# Patient Record
Sex: Male | Born: 1965 | Race: Black or African American | Hispanic: No | Marital: Single | Smoking: Current every day smoker
Health system: Southern US, Community
[De-identification: ages and names within clinical notes are randomized; demographics above are authoritative.]

## PROBLEM LIST (undated history)

## (undated) DIAGNOSIS — N289 Disorder of kidney and ureter, unspecified: Secondary | ICD-10-CM

## (undated) DIAGNOSIS — Z951 Presence of aortocoronary bypass graft: Secondary | ICD-10-CM

## (undated) HISTORY — PX: AV FISTULA PLACEMENT: SHX1204

---

## 2018-12-27 ENCOUNTER — Other Ambulatory Visit: Payer: Self-pay

## 2018-12-27 ENCOUNTER — Emergency Department (HOSPITAL_COMMUNITY)
Admission: EM | Admit: 2018-12-27 | Discharge: 2018-12-27 | Disposition: A | Discharge status: Home / Self Care | Payer: Medicare Other | Attending: Emergency Medicine | Admitting: Emergency Medicine

## 2018-12-27 ENCOUNTER — Emergency Department (HOSPITAL_COMMUNITY): Payer: Medicare Other

## 2018-12-27 ENCOUNTER — Encounter (HOSPITAL_COMMUNITY): Payer: Self-pay | Admitting: Emergency Medicine

## 2018-12-27 DIAGNOSIS — F17200 Nicotine dependence, unspecified, uncomplicated: Secondary | ICD-10-CM | POA: Diagnosis not present

## 2018-12-27 DIAGNOSIS — Z992 Dependence on renal dialysis: Secondary | ICD-10-CM | POA: Insufficient documentation

## 2018-12-27 DIAGNOSIS — N186 End stage renal disease: Secondary | ICD-10-CM | POA: Insufficient documentation

## 2018-12-27 DIAGNOSIS — R0602 Shortness of breath: Secondary | ICD-10-CM | POA: Insufficient documentation

## 2018-12-27 DIAGNOSIS — R079 Chest pain, unspecified: Secondary | ICD-10-CM | POA: Diagnosis present

## 2018-12-27 DIAGNOSIS — R0600 Dyspnea, unspecified: Secondary | ICD-10-CM | POA: Insufficient documentation

## 2018-12-27 HISTORY — DX: Presence of aortocoronary bypass graft: Z95.1

## 2018-12-27 HISTORY — DX: Disorder of kidney and ureter, unspecified: N28.9

## 2018-12-27 LAB — CBC
HCT: 46.2 % (ref 39.0–52.0)
Hemoglobin: 14.4 g/dL (ref 13.0–17.0)
MCH: 31.3 pg (ref 26.0–34.0)
MCHC: 31.2 g/dL (ref 30.0–36.0)
MCV: 100.4 fL — ABNORMAL HIGH (ref 80.0–100.0)
Platelets: 155 10*3/uL (ref 150–400)
RBC: 4.6 MIL/uL (ref 4.22–5.81)
RDW: 13.4 % (ref 11.5–15.5)
WBC: 5.3 10*3/uL (ref 4.0–10.5)
nRBC: 0 % (ref 0.0–0.2)

## 2018-12-27 LAB — TROPONIN I (HIGH SENSITIVITY)
Troponin I (High Sensitivity): UNDETERMINED ng/L (ref <18)
Troponin I (High Sensitivity): 25 ng/L — ABNORMAL HIGH (ref <18)

## 2018-12-27 LAB — BASIC METABOLIC PANEL
Anion gap: 18 — ABNORMAL HIGH (ref 5–15)
BUN: 42 mg/dL — ABNORMAL HIGH (ref 6–20)
CO2: 21 mmol/L — ABNORMAL LOW (ref 22–32)
Calcium: 9.8 mg/dL (ref 8.9–10.3)
Chloride: 95 mmol/L — ABNORMAL LOW (ref 98–111)
Creatinine, Ser: 12.43 mg/dL — ABNORMAL HIGH (ref 0.61–1.24)
GFR calc Af Amer: 5 mL/min — ABNORMAL LOW (ref >60)
GFR calc non Af Amer: 4 mL/min — ABNORMAL LOW (ref >60)
Glucose, Bld: 101 mg/dL — ABNORMAL HIGH (ref 70–99)
Potassium: 5.4 mmol/L — ABNORMAL HIGH (ref 3.5–5.1)
Sodium: 134 mmol/L — ABNORMAL LOW (ref 135–145)

## 2018-12-27 MED ORDER — NITROGLYCERIN 0.4 MG SL SUBL
0.4000 mg | SUBLINGUAL_TABLET | Freq: Once | SUBLINGUAL | Status: AC
  Administered 2018-12-27: 0.4 mg via SUBLINGUAL
  Filled 2018-12-27: qty 1

## 2018-12-27 MED ORDER — NITROGLYCERIN 0.4 MG SL SUBL: 0.4000 mg | SUBLINGUAL_TABLET | Freq: Once | SUBLINGUAL | Status: DC

## 2018-12-27 MED ORDER — NITROGLYCERIN 0.4 MG SL SUBL
0.4000 mg | SUBLINGUAL_TABLET | Freq: Once | SUBLINGUAL | Status: AC | PRN
  Administered 2018-12-27: 0.4 mg via SUBLINGUAL

## 2018-12-27 MED ORDER — SODIUM CHLORIDE 0.9% FLUSH: 3.0000 mL | Freq: Once | INTRAVENOUS | Status: DC

## 2018-12-27 NOTE — ED Notes (Signed)
Nurse first spoke with pt.  He is here for chest pain.  States someone attempted to get labs without success.  Pt back to triage for labwork.

## 2018-12-27 NOTE — ED Notes (Signed)
Multiple attempts to redraw trop with EMT's. Phlebotomy is at bedside to attempt.

## 2018-12-27 NOTE — ED Triage Notes (Signed)
Pt presents today with midsternal chest pain that started 2 days ago. Pt reports the pain is the same thing he had when he had his triple by-pass.

## 2018-12-27 NOTE — Discharge Instructions (Addendum)
Appears to be due to some inflammation of the sac around her heart.  Given your history and your chest pain I do recommend you to see a cardiologist sometime in the very near future.

## 2018-12-27 NOTE — ED Notes (Signed)
Patient verbalizes understanding of discharge instructions. Opportunity for questioning and answers were provided. Armband removed by staff, pt discharged from ED.  

## 2018-12-27 NOTE — ED Provider Notes (Addendum)
[place] EMERGENCY DEPARTMENT Provider Note   CSN: [id number] Arrival date & time: 12/27/18  1240     History   Chief Complaint Chief Complaint  Patient presents with  . Chest Pain    HPI John Haley is a 53 y.o. male who presents with roughly 10-14 day history of substernal chest pain.  He states that the pain feels like there is a heaviness in his chest and he has having some subjective trouble breathing.  Patient says he is not taking thing for the pain.  He says that "kind of feels like when I had to have my bypass done".  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  Patient has ESRD and dialyzes through a right EC fistula.  He dialyzes on Monday Wednesday Friday.  He takes Imdur 30 mg daily on days he does not get dialysis.  He states he did not take his Imdur today.  He has been having exertional chest pain since January 2020 per care everywhere notes.  Past Medical History:  Diagnosis Date  . Renal disorder    Stage IV  . S/P triple vessel bypass     There are no active problems to display for this patient.   Past Surgical History:  Procedure Laterality Date  . AV FISTULA PLACEMENT      Home Medications    Prior to Admission medications   Not on File    Family History No family history on file.  Social History Social History   Tobacco Use  . Smoking status: Current Every Day Smoker  . Smokeless tobacco: Never Used  Substance Use Topics  . Alcohol use: Not Currently  . Drug use: Never     Allergies   Patient has no known allergies.   Review of Systems Review of Systems  Constitutional: Negative for activity change, appetite change, chills, fatigue and fever.  Respiratory: Positive for chest tightness and shortness of breath.   Cardiovascular: Positive for chest pain. Negative for palpitations and leg swelling.  Gastrointestinal: Negative for constipation, diarrhea, nausea and vomiting.  Musculoskeletal: Negative for  arthralgias and joint swelling.  Skin: Negative for color change.  Neurological: Negative for dizziness, weakness and light-headedness.     Physical Exam Updated Vital Signs BP (!) 159/93   Pulse 64   Temp 97.6 F (36.4 C) (Oral)   Resp 19   Ht 6' (1.829 m)   Wt 88.5 kg   SpO2 100%   BMI 26.45 kg/m   Physical Exam Constitutional:      General: He is not in acute distress.    Appearance: He is well-developed and normal weight. He is not ill-appearing.  Cardiovascular:     Rate and Rhythm: Normal rate and regular rhythm.     Heart sounds: Normal heart sounds. No murmur. No friction rub.  Pulmonary:     Breath sounds: Normal breath sounds.  Chest:     Chest wall: No mass or deformity.  Abdominal:     General: Bowel sounds are normal. There is no abdominal bruit.     Palpations: Abdomen is soft. There is no fluid wave.  Musculoskeletal: Normal range of motion.     Right lower leg: No edema.     Left lower leg: No edema.     Comments: Right brachiocephalic AV fistula in place.  Good thrill.  Neurological:     General: No focal deficit present.     Mental Status: He is alert.  Cranial Nerves: No cranial nerve deficit.      ED Treatments / Results  Labs (all labs ordered are listed, but only abnormal results are displayed) Labs Reviewed  BASIC METABOLIC PANEL - Abnormal; Notable for the following components:      Result Value   Sodium 134 (*)    Potassium 5.4 (*)    Chloride 95 (*)    CO2 21 (*)    Glucose, Bld 101 (*)    BUN 42 (*)    Creatinine, Ser 12.43 (*)    GFR calc non Af Amer 4 (*)    GFR calc Af Amer 5 (*)    Anion gap 18 (*)    All other components within normal limits  CBC - Abnormal; Notable for the following components:   MCV 100.4 (*)    All other components within normal limits  TROPONIN I (HIGH SENSITIVITY)  TROPONIN I (HIGH SENSITIVITY)    EKG EKG Interpretation  Date/Time:  Sunday December 27 2018 12:55:13 EDT Ventricular Rate:  82  PR Interval:  170 QRS Duration: 110 QT Interval:  382 QTC Calculation: 446 R Axis:   -74 Text Interpretation:  Normal sinus rhythm Incomplete right bundle branch block Left anterior fascicular block Confirmed by Zavitz, Joshua ([id number]) on 12/27/2018 5:48:19 PM   Radiology Dg Chest 2 View  Result Date: 12/27/2018 CLINICAL DATA:  Pt presents today with midsternal chest pain that started 2 days ago. Pt reports the pain is the same thing he had when he had his triple by-pass. chest pain EXAM: CHEST - 2 VIEW COMPARISON:  None available FINDINGS: RIGHT subclavian vascular stent noted. Sternotomy wires overlie normal cardiac silhouette. No effusion, infiltrate pneumothorax. No acute osseous abnormality. IMPRESSION: No acute cardiopulmonary process. Electronically Signed   By: Stewart  Edmunds M.D.   On: 12/27/2018 14:11    Procedures Procedures (including critical care time)  Medications Ordered in ED Medications  sodium chloride flush (NS) 0.9 % injection 3 mL (has no administration in time range)  nitroGLYCERIN (NITROSTAT) SL tablet 0.4 mg (has no administration in time range)     Initial Impression / Assessment and Plan / ED Course  I have reviewed the triage vital signs and the nursing notes.  Pertinent labs & imaging results that were available during my care of the patient were reviewed by me and considered in my medical decision making (see chart for details).        53  year old male who presents with 1 week history of substernal chest pain. States that it feels similar as his bypass back in 2015. Patient with apparently new RBBB on ekg, but does not meet criteria as QRS only 124ms. Cannot compare with old EKGs as only outside cone ekgs available via machine read. Initial HS troponin with no result due to inadequate sample. Redraw pending. Heart score 4.  hsTrop 25 on redraw after 5 hours of being in ED. His chest pain did respond well to nitroglycerin SL. Given these new findings,  heart score elevated to 5. Given patient with borderline heart score, difficult to interpret hstrop 2/2 ESRD, and possible new change on EKG will discuss case with Cardiology for further recommendations.  Discussed with on-call cardiology fellow.  Felt that patient's EKG is suspicious for pericarditis.  Felt that EKG findings, a hstroponin and time course were reassuring.  Recommended very close outpatient cardiology follow-up.  Patient does not have an outpatient cardiologist placed an urgent referral to [facility] cardiology.  Final Clinical Impressions(s) /  ED Diagnoses   Final diagnoses:  None    ED Discharge Orders    None       Guadalupe Dawn, MD 12/27/18 Rae Halsted, MD 12/27/18 Casimer Lanius    Elnora Morrison, MD 12/27/18 [phone]

## 2018-12-27 NOTE — ED Notes (Signed)
Attempted repeat blood draw x1. Unsuccessful.

## 2020-09-01 IMAGING — CR CHEST - 2 VIEW
2 series · 2 of 2 positions shown · non-contrast
Comparison: None available

CLINICAL DATA: Pt presents today with midsternal chest pain that
started 2 days ago. Pt reports the pain is the same thing he had
when he had his triple by-pass. chest pain

EXAM:
CHEST - 2 VIEW

[chest pa]
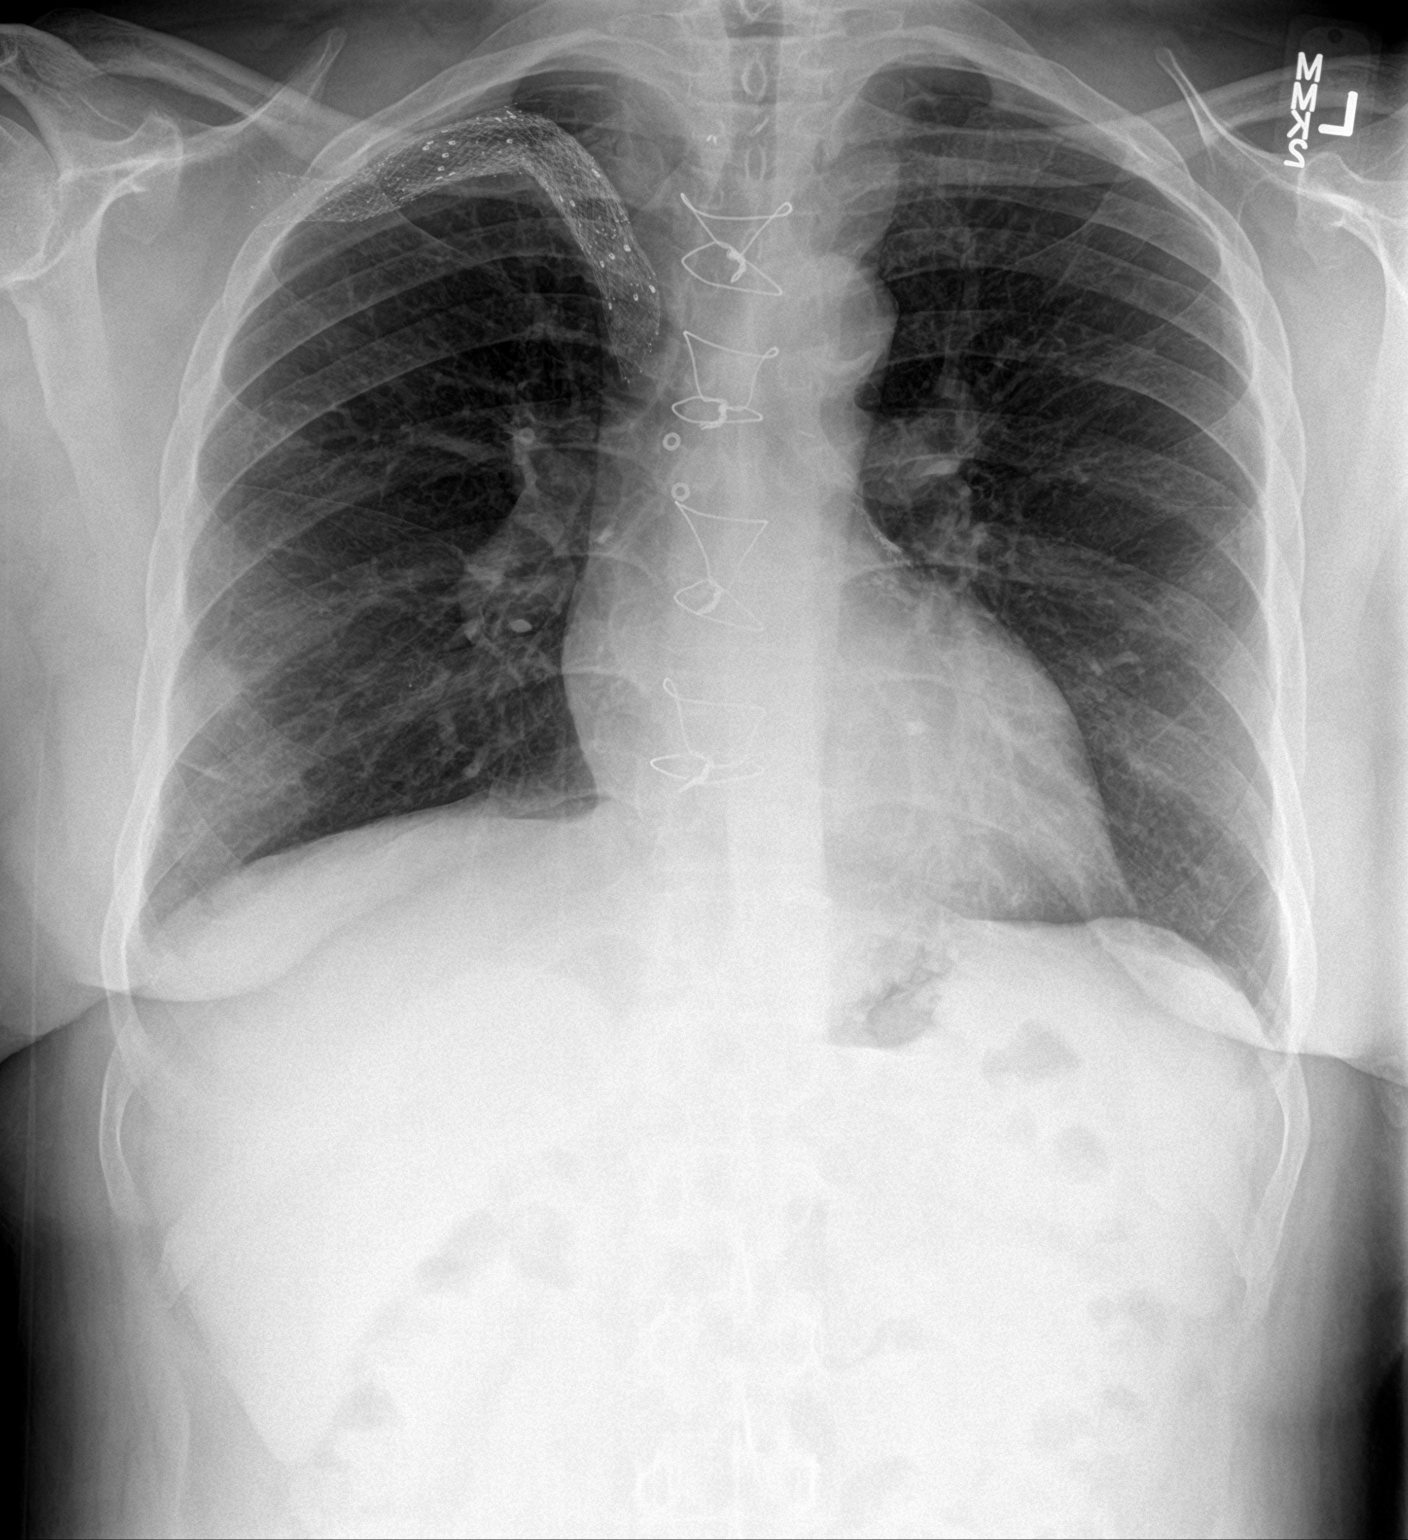

[chest lat]
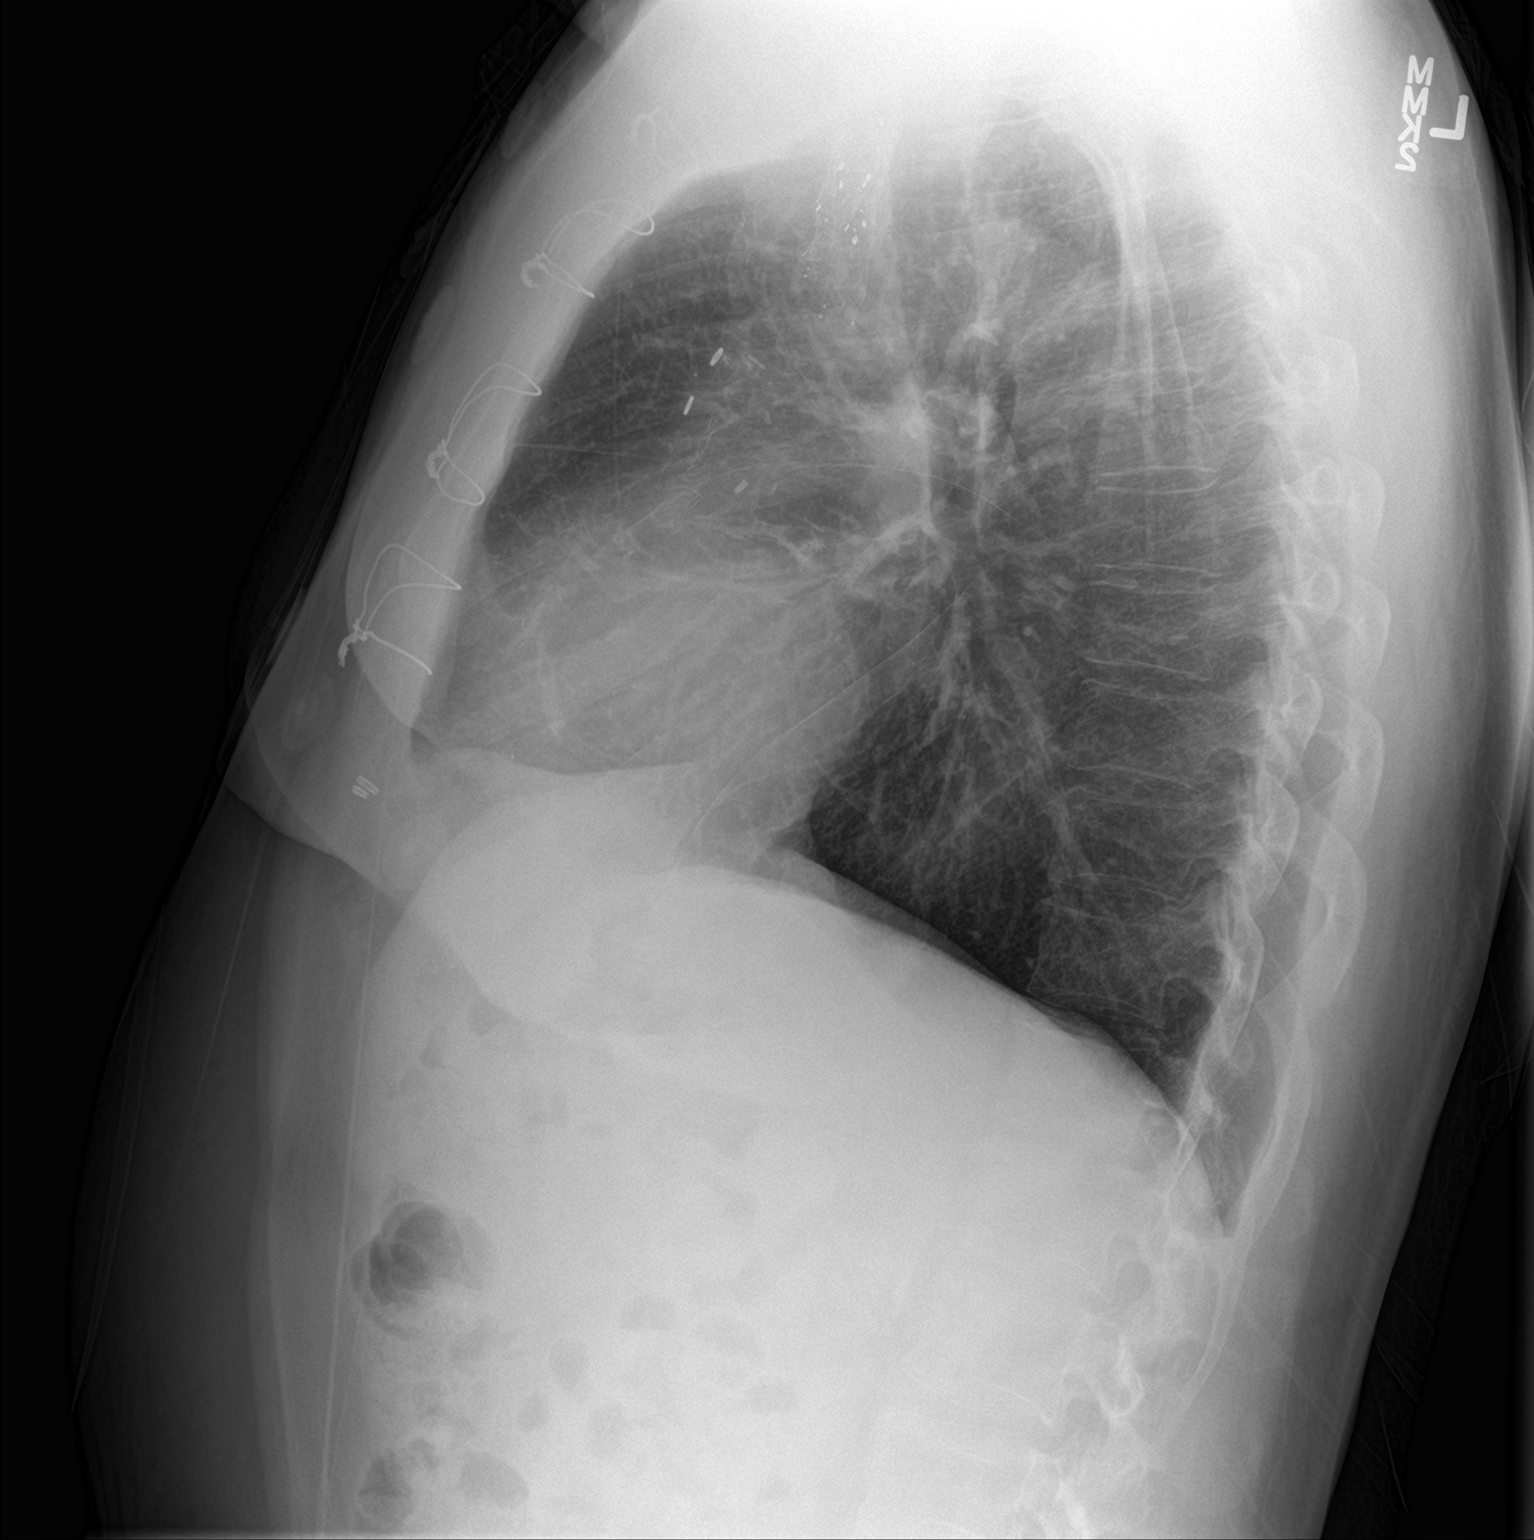

[2 of 2 positions shown; findings below may reference images not displayed]

FINDINGS: RIGHT subclavian vascular stent noted. Sternotomy wires overlie
normal cardiac silhouette. No effusion, infiltrate pneumothorax. No
acute osseous abnormality.
IMPRESSION: No acute cardiopulmonary process.
